# Patient Record
Sex: Male | Born: 1960 | Race: Black or African American | Hispanic: No | Marital: Single | Smoking: Current every day smoker
Health system: Southern US, Community
[De-identification: ages and names within clinical notes are randomized; demographics above are authoritative.]

## PROBLEM LIST (undated history)

## (undated) HISTORY — PX: NECK SURGERY: SHX720

---

## 2000-11-18 ENCOUNTER — Encounter: Payer: Self-pay | Admitting: Emergency Medicine

## 2000-11-18 ENCOUNTER — Emergency Department (HOSPITAL_COMMUNITY): Admission: EM | Admit: 2000-11-18 | Discharge: 2000-11-18 | Payer: Self-pay | Admitting: *Deleted

## 2004-06-21 ENCOUNTER — Inpatient Hospital Stay (HOSPITAL_COMMUNITY): Admission: AD | Admit: 2004-06-21 | Discharge: 2004-07-02 | Payer: Self-pay | Admitting: Psychiatry

## 2004-06-21 ENCOUNTER — Ambulatory Visit: Payer: Self-pay | Admitting: Psychiatry

## 2008-08-23 ENCOUNTER — Emergency Department: Payer: Self-pay | Admitting: Emergency Medicine

## 2011-09-17 ENCOUNTER — Ambulatory Visit: Payer: Self-pay | Admitting: Rheumatology

## 2012-08-26 ENCOUNTER — Emergency Department (HOSPITAL_COMMUNITY)
Admission: EM | Admit: 2012-08-26 | Discharge: 2012-08-26 | Disposition: A | Discharge status: Home / Self Care | Payer: PRIVATE HEALTH INSURANCE | Attending: Emergency Medicine | Admitting: Emergency Medicine

## 2012-08-26 ENCOUNTER — Emergency Department (HOSPITAL_COMMUNITY): Payer: PRIVATE HEALTH INSURANCE

## 2012-08-26 ENCOUNTER — Encounter (HOSPITAL_COMMUNITY): Payer: Self-pay | Admitting: *Deleted

## 2012-08-26 DIAGNOSIS — F17200 Nicotine dependence, unspecified, uncomplicated: Secondary | ICD-10-CM | POA: Insufficient documentation

## 2012-08-26 DIAGNOSIS — R0789 Other chest pain: Secondary | ICD-10-CM | POA: Insufficient documentation

## 2012-08-26 DIAGNOSIS — R209 Unspecified disturbances of skin sensation: Secondary | ICD-10-CM | POA: Insufficient documentation

## 2012-08-26 LAB — POCT I-STAT TROPONIN I

## 2012-08-26 LAB — COMPREHENSIVE METABOLIC PANEL
AST: 25 U/L (ref 0–37)
Albumin: 3.7 g/dL (ref 3.5–5.2)
Alkaline Phosphatase: 49 U/L (ref 39–117)
Chloride: 106 mEq/L (ref 96–112)
Potassium: 4.2 mEq/L (ref 3.5–5.1)
Total Bilirubin: 0.1 mg/dL — ABNORMAL LOW (ref 0.3–1.2)

## 2012-08-26 LAB — CBC
Platelets: 291 10*3/uL (ref 150–400)
RDW: 15.3 % (ref 11.5–15.5)
WBC: 4.5 10*3/uL (ref 4.0–10.5)

## 2012-08-26 MED ORDER — TRAMADOL HCL 50 MG PO TABS: 50.0000 mg | ORAL_TABLET | Freq: Four times a day (QID) | ORAL | Status: AC | PRN

## 2012-08-26 MED ORDER — NITROGLYCERIN 0.4 MG SL SUBL
0.4000 mg | SUBLINGUAL_TABLET | SUBLINGUAL | Status: AC | PRN
  Administered 2012-08-26 (×3): 0.4 mg via SUBLINGUAL
  Filled 2012-08-26: qty 75

## 2012-08-26 MED ORDER — ASPIRIN 81 MG PO CHEW
324.0000 mg | CHEWABLE_TABLET | Freq: Once | ORAL | Status: AC
  Administered 2012-08-26: 324 mg via ORAL
  Filled 2012-08-26: qty 4

## 2012-08-26 MED ORDER — OXYCODONE-ACETAMINOPHEN 5-325 MG PO TABS
1.0000 | ORAL_TABLET | Freq: Once | ORAL | Status: AC
  Administered 2012-08-26: 1 via ORAL
  Filled 2012-08-26: qty 1

## 2012-08-26 NOTE — ED Provider Notes (Signed)
History     CSN: [id number]  Arrival date & time 08/26/12  6213   First MD Initiated Contact with Patient 08/26/12 [phone]      Chief Complaint  Patient presents with  . Tingling  . Chest Pain    (Consider location/radiation/quality/duration/timing/severity/associated sxs/prior treatment) HPI  Louis Johnston is a 52 y.o. male c/o left-sided chest pain, radiating to the left arm, described as sharp, rated at 8/10 at worst, 8/10 now. Relieved by nothing. Pain started x3days ago. Pain has been constant, non-exertional. Patient endorses a positional pain that is exacerbated by palpation and deep breathing.  Denies SOB, N/V, diaphoresis, cough, fever, back pain, syncope, prior episodes, recent cocaine/methamphetimine use. Denies h/o DVT, PE,  recent travel/immobilization, leg swelling, hemoptysis.  Pt has not received any ASA or NTG in the last 24 hours.  RF: tobacco abuse Cath: ?   Last Stress test: ? Cardiologost: ? PCP:?   History reviewed. No pertinent past medical history.  Past Surgical History  Procedure Laterality Date  . Neck surgery      No family history on file.  History  Substance Use Topics  . Smoking status: Current Every Day Smoker -- 0.50 packs/day    Types: Cigarettes  . Smokeless tobacco: Not on file  . Alcohol Use: No      Review of Systems  Constitutional: Negative for fever.  Respiratory: Negative for shortness of breath.   Cardiovascular: Positive for chest pain.  Gastrointestinal: Negative for nausea, vomiting, abdominal pain and diarrhea.  All other systems reviewed and are negative.    Allergies  Banana and Tomato  Home Medications  No current outpatient prescriptions on file.  BP 125/79  Pulse 61  Temp(Src) 98.7 F (37.1 C) (Oral)  Resp 20  Ht 5\' 11"  (1.803 m)  Wt 165 lb (74.844 kg)  BMI 23.02 kg/m2  SpO2 99%  Physical Exam  Nursing note and vitals reviewed. Constitutional: He is oriented to person, place, and time. He  appears well-developed and well-nourished. No distress.  HENT:  Head: Normocephalic and atraumatic.  Mouth/Throat: Oropharynx is clear and moist.  Eyes: Conjunctivae and EOM are normal. Pupils are equal, round, and reactive to light.  Neck: Normal range of motion.  Cardiovascular: Normal rate, regular rhythm, normal heart sounds and intact distal pulses.   Pulmonary/Chest: Effort normal and breath sounds normal. No stridor. No respiratory distress. He has no wheezes. He has no rales. He exhibits tenderness.    Chest pain is  reproducible and there is exquisite tenderness to palpation of the left lower chest. No crepitance.   Abdominal: Soft. Bowel sounds are normal. He exhibits no distension and no mass. There is no tenderness. There is no rebound and no guarding.  Musculoskeletal: Normal range of motion.  Neurological: He is alert and oriented to person, place, and time.  Strength is 5 out of 5x4 extremities, distal sensation intact to pinprick and light touch  Psychiatric: He has a normal mood and affect.    ED Course  Procedures (including critical care time)  Labs Reviewed  COMPREHENSIVE METABOLIC PANEL - Abnormal; Notable for the following:    Total Bilirubin 0.1 (*)    All other components within normal limits  CBC  D-DIMER, QUANTITATIVE  POCT I-STAT TROPONIN I   Dg Chest 2 View  08/26/2012  *RADIOLOGY REPORT*  Clinical Data: Chest tightness and shortness of breath.  Smoker.  CHEST - 2 VIEW  Comparison: None.  Findings: Lungs clear. Heart size normal. No pneumothorax or  pleural effusion. Postoperative change of lower cervical fusion is noted.  The patient is also status post rotator cuff repair on the right.  IMPRESSION: Negative chest.   Original Report Authenticated By: Holley Dexter, M.D.     Date: 08/26/2012  Rate: 61  Rhythm: normal sinus rhythm  QRS Axis: normal  Intervals: normal  ST/T Wave abnormalities: normal  Conduction Disutrbances:none  Narrative  Interpretation:   Old EKG Reviewed: unchanged   1. Atypical chest pain       MDM   Louis Johnston is a 52 y.o. male with positional left chest pain for the last 3 days. EKG is nonischemic and troponin is negative, CBC shows no anemia, there are no electrolyte abnormalities and chest x-ray is normal. PE unlikely based on lack of risk factors and physical exam.   This is a shared visit with the attending physician who personally evaluated the patient and agrees with the care plan.   Filed Vitals:   08/26/12 0923 08/26/12 0936  BP: 125/79   Pulse: 61   Temp: 98.7 F (37.1 C)   TempSrc: Oral   Resp: 20   Height: 5\' 11"  (1.803 m) 5\' 11"  (1.803 m)  Weight: 165 lb (74.844 kg) 165 lb (74.844 kg)  SpO2: 99%      Pt verbalized understanding and agrees with care plan. Outpatient follow-up and return precautions given.    New Prescriptions   TRAMADOL (ULTRAM) 50 MG TABLET    Take 1 tablet (50 mg total) by mouth every 6 (six) hours as needed for pain.           Wynetta Emery, PA-C 08/26/12 1249

## 2012-08-26 NOTE — ED Notes (Signed)
Pt complains of left hand tingling x 3 days.

## 2012-08-26 NOTE — Discharge Instructions (Signed)
For pain control you may take:  800mg of ibuprofen (that is usually 4 over the counter pills)  3 times a day (take with food) andacetaminophen 975mg (this is 3 over the counter pills) four times a day. Do not drink alcohol or combine with other medications that have acetaminophen as an ingredient (Read the labels!).  For breakthrough pain you may take tramadol. Do not drink alcohol drive or operate heavy machinery when taking tramadol. ° ° °

## 2012-08-26 NOTE — ED Provider Notes (Signed)
Medical screening examination/treatment/procedure(s) were conducted as a shared visit with non-physician practitioner(s) and myself.  I personally evaluated the patient during the encounter  Constant L sided chest pain worse with palpation, worse with position changes.  RRR, CTAB.  Glynn Octave, MD 08/26/12 1705

## 2012-08-26 NOTE — ED Notes (Signed)
Pt complains of having chest pain and tingling in his left arm x 3 days.

## 2014-05-09 ENCOUNTER — Encounter (HOSPITAL_COMMUNITY): Payer: Self-pay | Admitting: Emergency Medicine

## 2014-05-09 ENCOUNTER — Emergency Department (HOSPITAL_COMMUNITY)
Admission: EM | Admit: 2014-05-09 | Discharge: 2014-05-09 | Disposition: A | Discharge status: Home / Self Care | Payer: Medicare PPO | Attending: Emergency Medicine | Admitting: Emergency Medicine

## 2014-05-09 DIAGNOSIS — Z720 Tobacco use: Secondary | ICD-10-CM | POA: Insufficient documentation

## 2014-05-09 DIAGNOSIS — M109 Gout, unspecified: Secondary | ICD-10-CM | POA: Diagnosis not present

## 2014-05-09 DIAGNOSIS — M79671 Pain in right foot: Secondary | ICD-10-CM | POA: Diagnosis present

## 2014-05-09 MED ORDER — OXYCODONE-ACETAMINOPHEN 5-325 MG PO TABS
1.0000 | ORAL_TABLET | Freq: Once | ORAL | Status: AC
  Administered 2014-05-09: 1 via ORAL
  Filled 2014-05-09: qty 1

## 2014-05-09 MED ORDER — OXYCODONE-ACETAMINOPHEN 5-325 MG PO TABS: 1.0000 | ORAL_TABLET | Freq: Four times a day (QID) | ORAL | Status: DC | PRN

## 2014-05-09 MED ORDER — INDOMETHACIN 50 MG PO CAPS: 50.0000 mg | ORAL_CAPSULE | Freq: Three times a day (TID) | ORAL | Status: AC

## 2014-05-09 NOTE — ED Notes (Signed)
Pt c/o sharp pain to right foot since christmas day. Pt states foot and toes are swollen. Denies injury.

## 2014-05-09 NOTE — ED Provider Notes (Signed)
CSN: [id number]     Arrival date & time 05/09/14  1126 History   First MD Initiated Contact with Patient 05/09/14 1137     Chief Complaint  Patient presents with  . Foot Pain     (Consider location/radiation/quality/duration/timing/severity/associated sxs/prior Treatment) HPI Comments: Patient with a history of Gout presents today with right foot pain.  Pain has been present for the past 4 days and is gradually worsening.  He reports that his entire foot is painful, but the pain is worse in the area of the MTP of the right great toe.  He reports associated redness and warmth of the area.  He has not taken anything for pain prior to arrival.  He states that he drank alcohol and ate red meat on Christmas.  Denies any injury or trauma.  No numbness or tingling.  No fever, chills, nausea, or vomiting.    The history is provided by the patient.    History reviewed. No pertinent past medical history. Past Surgical History  Procedure Laterality Date  . Neck surgery     No family history on file. History  Substance Use Topics  . Smoking status: Current Every Day Smoker -- 0.50 packs/day    Types: Cigarettes  . Smokeless tobacco: Not on file  . Alcohol Use: No    Review of Systems  All other systems reviewed and are negative.     Allergies  Banana and Tomato  Home Medications   Prior to Admission medications   Medication Sig Start Date End Date Taking? Authorizing Provider  traMADol (ULTRAM) 50 MG tablet Take 1 tablet (50 mg total) by mouth every 6 (six) hours as needed for pain. 08/26/12   Nicole Pisciotta, PA-C   BP 152/95 mmHg  Pulse 84  Temp(Src) 98.3 F (36.8 C) (Oral)  Resp 20  SpO2 98% Physical Exam  Constitutional: He appears well-developed and well-nourished.  HENT:  Head: Normocephalic and atraumatic.  Neck: Normal range of motion.  Cardiovascular: Normal rate, regular rhythm and normal heart sounds.   Pulses:      Dorsalis pedis pulses are 2+ on the right  side, and 2+ on the left side.  Pulmonary/Chest: Effort normal and breath sounds normal.  Musculoskeletal: Normal range of motion.  Erythema, tenderness, warmth, and mild swelling of the right 1st MTP  Neurological: He is alert.  Distal sensation of the right foot intact  Skin: Skin is warm and dry.  Psychiatric: He has a normal mood and affect.  Nursing note and vitals reviewed.   ED Course  Procedures (including critical care time) Labs Review Labs Reviewed - No data to display  Imaging Review No results found.   EKG Interpretation None      MDM   Final diagnoses:  None   Patient with a history of Gout presents today with pain, erythema, and swelling of the MTP of the right great toe.  Signs and symptoms most consistent with Gout.  He is afebrile.  No injury or trauma.  Neurovascularly intact.  Feel that the patient is stable for discharge.  Discharged with Rx for Indomethicin.  Instructed to follow up with PCP.  Return precautions given.     Santiago GladHeather Imara Standiford, PA-C 05/09/14 2322  Merrie RoofJohn David Wofford III, MD 05/10/14 [phone]

## 2014-05-23 ENCOUNTER — Encounter (HOSPITAL_COMMUNITY): Payer: Self-pay | Admitting: Emergency Medicine

## 2014-05-23 ENCOUNTER — Emergency Department (HOSPITAL_COMMUNITY): Payer: Medicare PPO

## 2014-05-23 ENCOUNTER — Emergency Department (HOSPITAL_COMMUNITY)
Admission: EM | Admit: 2014-05-23 | Discharge: 2014-05-23 | Disposition: A | Discharge status: Home / Self Care | Payer: Medicare PPO | Attending: Emergency Medicine | Admitting: Emergency Medicine

## 2014-05-23 DIAGNOSIS — M79674 Pain in right toe(s): Secondary | ICD-10-CM | POA: Insufficient documentation

## 2014-05-23 DIAGNOSIS — Z791 Long term (current) use of non-steroidal anti-inflammatories (NSAID): Secondary | ICD-10-CM | POA: Insufficient documentation

## 2014-05-23 DIAGNOSIS — M109 Gout, unspecified: Secondary | ICD-10-CM | POA: Diagnosis not present

## 2014-05-23 DIAGNOSIS — Z720 Tobacco use: Secondary | ICD-10-CM | POA: Diagnosis not present

## 2014-05-23 LAB — I-STAT CHEM 8, ED
BUN: 13 mg/dL (ref 6–23)
BUN: 19 mg/dL (ref 6–23)
BUN: 19 mg/dL (ref 6–23)
CALCIUM ION: 1.28 mmol/L — AB (ref 1.12–1.23)
CHLORIDE: 105 meq/L (ref 96–112)
CREATININE: 1 mg/dL (ref 0.50–1.35)
Calcium, Ion: 1.17 mmol/L (ref 1.12–1.23)
Calcium, Ion: 1.18 mmol/L (ref 1.12–1.23)
Chloride: 108 mEq/L (ref 96–112)
Chloride: 108 mEq/L (ref 96–112)
Creatinine, Ser: 1.1 mg/dL (ref 0.50–1.35)
Creatinine, Ser: 1.2 mg/dL (ref 0.50–1.35)
GLUCOSE: 97 mg/dL (ref 70–99)
Glucose, Bld: 91 mg/dL (ref 70–99)
Glucose, Bld: 92 mg/dL (ref 70–99)
HCT: 53 % — ABNORMAL HIGH (ref 39.0–52.0)
HEMATOCRIT: 54 % — AB (ref 39.0–52.0)
HEMATOCRIT: 54 % — AB (ref 39.0–52.0)
HEMOGLOBIN: 18.4 g/dL — AB (ref 13.0–17.0)
Hemoglobin: 18 g/dL — ABNORMAL HIGH (ref 13.0–17.0)
Hemoglobin: 18.4 g/dL — ABNORMAL HIGH (ref 13.0–17.0)
POTASSIUM: 6.2 mmol/L — AB (ref 3.5–5.1)
POTASSIUM: 4.9 mmol/L (ref 3.5–5.1)
POTASSIUM: 6.6 mmol/L — AB (ref 3.5–5.1)
SODIUM: 140 mmol/L (ref 135–145)
SODIUM: 140 mmol/L (ref 135–145)
Sodium: 142 mmol/L (ref 135–145)
TCO2: 26 mmol/L (ref 0–100)
TCO2: 25 mmol/L (ref 0–100)
TCO2: 27 mmol/L (ref 0–100)

## 2014-05-23 LAB — CBC WITH DIFFERENTIAL/PLATELET
Basophils Absolute: 0.1 10*3/uL (ref 0.0–0.1)
Basophils Relative: 2 % — ABNORMAL HIGH (ref 0–1)
EOS ABS: 0.4 10*3/uL (ref 0.0–0.7)
EOS PCT: 6 % — AB (ref 0–5)
HCT: 50.4 % (ref 39.0–52.0)
Hemoglobin: 16.3 g/dL (ref 13.0–17.0)
LYMPHS ABS: 2.3 10*3/uL (ref 0.7–4.0)
Lymphocytes Relative: 33 % (ref 12–46)
MCH: 29.2 pg (ref 26.0–34.0)
MCHC: 32.3 g/dL (ref 30.0–36.0)
MCV: 90.2 fL (ref 78.0–100.0)
MONOS PCT: 7 % (ref 3–12)
Monocytes Absolute: 0.5 10*3/uL (ref 0.1–1.0)
Neutro Abs: 3.6 10*3/uL (ref 1.7–7.7)
Neutrophils Relative %: 52 % (ref 43–77)
Platelets: 340 10*3/uL (ref 150–400)
RBC: 5.59 MIL/uL (ref 4.22–5.81)
RDW: 15.6 % — ABNORMAL HIGH (ref 11.5–15.5)
WBC: 6.9 10*3/uL (ref 4.0–10.5)

## 2014-05-23 LAB — BASIC METABOLIC PANEL
ANION GAP: 7 (ref 5–15)
BUN: 14 mg/dL (ref 6–23)
CO2: 26 mmol/L (ref 19–32)
Calcium: 9.4 mg/dL (ref 8.4–10.5)
Chloride: 105 mEq/L (ref 96–112)
Creatinine, Ser: 1.01 mg/dL (ref 0.50–1.35)
GFR calc Af Amer: >90 mL/min (ref >90)
GFR calc non Af Amer: 82 mL/min — ABNORMAL LOW (ref >90)
GLUCOSE: 86 mg/dL (ref 70–99)
Potassium: 4 mmol/L (ref 3.5–5.1)
Sodium: 138 mmol/L (ref 135–145)

## 2014-05-23 MED ORDER — OXYCODONE-ACETAMINOPHEN 5-325 MG PO TABS: 2.0000 | ORAL_TABLET | Freq: Four times a day (QID) | ORAL | Status: AC | PRN

## 2014-05-23 MED ORDER — OXYCODONE-ACETAMINOPHEN 5-325 MG PO TABS
2.0000 | ORAL_TABLET | Freq: Once | ORAL | Status: AC
  Administered 2014-05-23: 2 via ORAL
  Filled 2014-05-23: qty 2

## 2014-05-23 NOTE — ED Notes (Signed)
Pt c/o right foot pain since Christmas, was seen on 05/09/14 for same. Pt states under his great toe, it is turning black and red.

## 2014-05-23 NOTE — ED Notes (Signed)
Patient transported to X-ray 

## 2014-05-23 NOTE — ED Notes (Signed)
MD at bedside. 

## 2014-05-23 NOTE — Discharge Instructions (Signed)

## 2014-05-23 NOTE — ED Provider Notes (Signed)
CSN: [id number]     Arrival date & time 05/23/14  0919 History   First MD Initiated Contact with Patient 05/23/14 0932     Chief Complaint  Patient presents with  . Foot Pain     (Consider location/radiation/quality/duration/timing/severity/associated sxs/prior Treatment) HPI Comments: Patient with no pertinent past medical history presents to emergency department with chief complaints of right great toe pain. Patient states that he was seen here recently around Christmas for the same. He was treated for gout with indomethacin. Patient states that he has had worsening toe pain, now his toes turning black and red. He denies any fevers, chills, nausea, vomiting. It is aggravated with palpation and movement. Patient denies any mechanism of injury. He denies any history of diabetes. When he was seen around Christmas he also had redness of the foot and ankle, this has since resolved.  The history is provided by the patient. No language interpreter was used.    History reviewed. No pertinent past medical history. Past Surgical History  Procedure Laterality Date  . Neck surgery     No family history on file. History  Substance Use Topics  . Smoking status: Current Every Day Smoker -- 0.50 packs/day    Types: Cigarettes  . Smokeless tobacco: Not on file  . Alcohol Use: No    Review of Systems  Constitutional: Negative for fever and chills.  Respiratory: Negative for shortness of breath.   Cardiovascular: Negative for chest pain.  Gastrointestinal: Negative for nausea, vomiting, diarrhea and constipation.  Genitourinary: Negative for dysuria.  Musculoskeletal: Positive for arthralgias.  Skin: Positive for color change.  All other systems reviewed and are negative.     Allergies  Banana and Tomato  Home Medications   Prior to Admission medications   Medication Sig Start Date End Date Taking? Authorizing Provider  indomethacin (INDOCIN) 50 MG capsule Take 1 capsule (50 mg total)  by mouth 3 (three) times daily with meals. 05/09/14   Santiago Glad, PA-C  oxyCODONE-acetaminophen (PERCOCET/ROXICET) 5-325 MG per tablet Take 1-2 tablets by mouth every 6 (six) hours as needed for severe pain. 05/09/14   Heather Laisure, PA-C  traMADol (ULTRAM) 50 MG tablet Take 1 tablet (50 mg total) by mouth every 6 (six) hours as needed for pain. 08/26/12   Nicole Pisciotta, PA-C   BP 148/90 mmHg  Pulse 77  Temp(Src) 97.9 F (36.6 C) (Oral)  Resp 19  SpO2 97% Physical Exam  Constitutional: He is oriented to person, place, and time. He appears well-developed and well-nourished.  HENT:  Head: Normocephalic and atraumatic.  Eyes: Conjunctivae and EOM are normal.  Neck: Normal range of motion.  Cardiovascular: Normal rate.   Pulmonary/Chest: Effort normal.  Abdominal: He exhibits no distension.  Musculoskeletal: Normal range of motion.  No obvious bony abnormality or deformity about the right great toe, great toe ROM and strength limited 2/2 pain, otherwise ROM and strength intact   Neurological: He is alert and oriented to person, place, and time.  Skin: Skin is dry.  Right great toe is moderately swollen, with some underlying soft tissue changes, possibly infection  Psychiatric: He has a normal mood and affect. His behavior is normal. Judgment and thought content normal.  Nursing note and vitals reviewed.   ED Course  Procedures (including critical care time) Results for orders placed or performed during the hospital encounter of 05/23/14  CBC with Differential  Result Value Ref Range   WBC 6.9 4.0 - 10.5 K/uL   RBC 5.59 4.22 -  5.81 MIL/uL   Hemoglobin 16.3 13.0 - 17.0 g/dL   HCT 96.0 45.4 - 09.8 %   MCV 90.2 78.0 - 100.0 fL   MCH 29.2 26.0 - 34.0 pg   MCHC 32.3 30.0 - 36.0 g/dL   RDW 11.9 (H) 14.7 - 82.9 %   Platelets 340 150 - 400 K/uL   Neutrophils Relative % 52 43 - 77 %   Neutro Abs 3.6 1.7 - 7.7 K/uL   Lymphocytes Relative 33 12 - 46 %   Lymphs Abs 2.3 0.7 - 4.0  K/uL   Monocytes Relative 7 3 - 12 %   Monocytes Absolute 0.5 0.1 - 1.0 K/uL   Eosinophils Relative 6 (H) 0 - 5 %   Eosinophils Absolute 0.4 0.0 - 0.7 K/uL   Basophils Relative 2 (H) 0 - 1 %   Basophils Absolute 0.1 0.0 - 0.1 K/uL  Basic metabolic panel  Result Value Ref Range   Sodium 138 135 - 145 mmol/L   Potassium 4.0 3.5 - 5.1 mmol/L   Chloride 105 96 - 112 mEq/L   CO2 26 19 - 32 mmol/L   Glucose, Bld 86 70 - 99 mg/dL   BUN 14 6 - 23 mg/dL   Creatinine, Ser 5.62 0.50 - 1.35 mg/dL   Calcium 9.4 8.4 - 13.0 mg/dL   GFR calc non Af Amer 82 (L) >90 mL/min   GFR calc Af Amer >90 >90 mL/min   Anion gap 7 5 - 15  I-stat chem 8, ed  Result Value Ref Range   Sodium 140 135 - 145 mmol/L   Potassium 6.2 (HH) 3.5 - 5.1 mmol/L   Chloride 108 96 - 112 mEq/L   BUN 19 6 - 23 mg/dL   Creatinine, Ser 8.65 0.50 - 1.35 mg/dL   Glucose, Bld 91 70 - 99 mg/dL   Calcium, Ion 7.84 6.96 - 1.23 mmol/L   TCO2 26 0 - 100 mmol/L   Hemoglobin 18.4 (H) 13.0 - 17.0 g/dL   HCT 29.5 (H) 28.4 - 13.2 %   Comment NOTIFIED PHYSICIAN   I-stat chem 8, ed  Result Value Ref Range   Sodium 140 135 - 145 mmol/L   Potassium 6.6 (HH) 3.5 - 5.1 mmol/L   Chloride 108 96 - 112 mEq/L   BUN 19 6 - 23 mg/dL   Creatinine, Ser 4.40 0.50 - 1.35 mg/dL   Glucose, Bld 92 70 - 99 mg/dL   Calcium, Ion 1.02 7.25 - 1.23 mmol/L   TCO2 27 0 - 100 mmol/L   Hemoglobin 18.4 (H) 13.0 - 17.0 g/dL   HCT 36.6 (H) 44.0 - 34.7 %   Comment NOTIFIED PHYSICIAN   I-stat chem 8, ed  Result Value Ref Range   Sodium 142 135 - 145 mmol/L   Potassium 4.9 3.5 - 5.1 mmol/L   Chloride 105 96 - 112 mEq/L   BUN 13 6 - 23 mg/dL   Creatinine, Ser 4.25 0.50 - 1.35 mg/dL   Glucose, Bld 97 70 - 99 mg/dL   Calcium, Ion 9.56 (H) 1.12 - 1.23 mmol/L   TCO2 25 0 - 100 mmol/L   Hemoglobin 18.0 (H) 13.0 - 17.0 g/dL   HCT 38.7 (H) 56.4 - 33.2 %   Dg Toe Great Right  05/23/2014   CLINICAL DATA:  Gout in right great toe. Pain. Soft tissue swelling with  discoloration.  EXAM: RIGHT GREAT TOE  COMPARISON:  None.  FINDINGS: There is no evidence of fracture or dislocation.  There is no evidence of arthropathy or other focal bone abnormality. Soft tissues are unremarkable.  IMPRESSION: Negative.   Electronically Signed   By: Charlett NoseKevin  Dover M.D.   On: 05/23/2014 10:25      EKG Interpretation None      MDM   Final diagnoses:  Toe pain, right    Patient with right toe pain. Labs and imaging are reassuring. No evidence of infection. Will treat with pain medicine, give postop shoe, and recommend outpatient follow-up with orthopedics. Patient seen by and discussed with Dr. Jeraldine LootsLockwood, who agrees with the plan. She is stable and ready for discharge.    Roxy Horsemanobert Ruffin Lada, PA-C 05/23/14 1625  Gerhard Munchobert Lockwood, MD 05/23/14 [phone]

## 2014-05-23 NOTE — ED Notes (Signed)
PA at bedside.

## 2014-05-23 NOTE — Progress Notes (Addendum)
Pt initially informed Cm he did not have pc but confirmed medicare and medicaid coverage CM educated pt that if [place] he is assigned to a pcp. His pcp is [facility] [phone] Pt is notw aware of theis Information entered in d/c instructions Pt is aware He states his medicaid card is broken and he is aware of DSS number and will get a new one

## 2015-03-13 DEATH — deceased

## 2016-05-21 IMAGING — CR DG TOE GREAT 2+V*R*
3 series · 3 of 3 positions shown · non-contrast
Comparison: None.

CLINICAL DATA: Gout in right great toe. Pain. Soft tissue swelling
with discoloration.

EXAM:
RIGHT GREAT TOE

[x toes ap right]
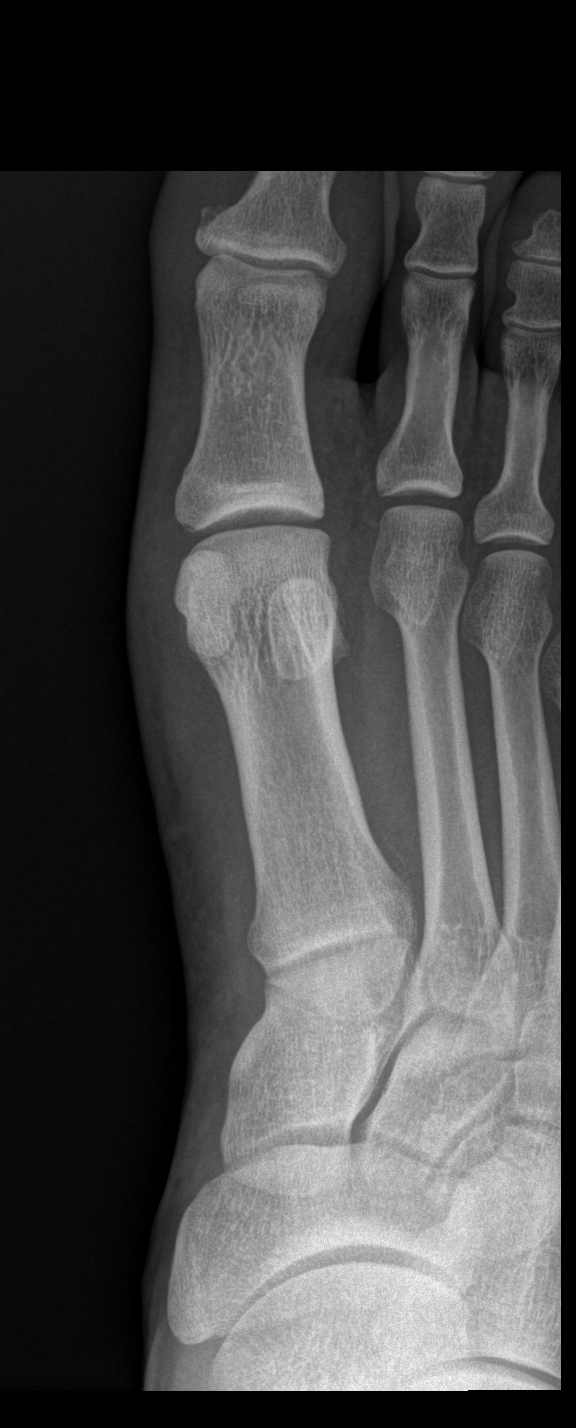

[x toes obl right]
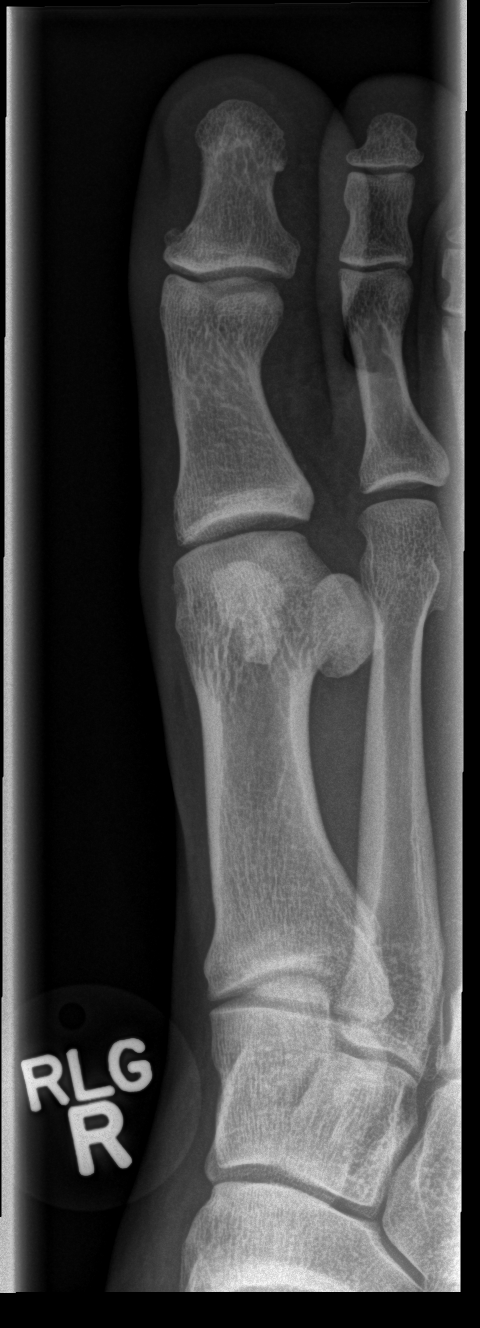

[x toes lat right]
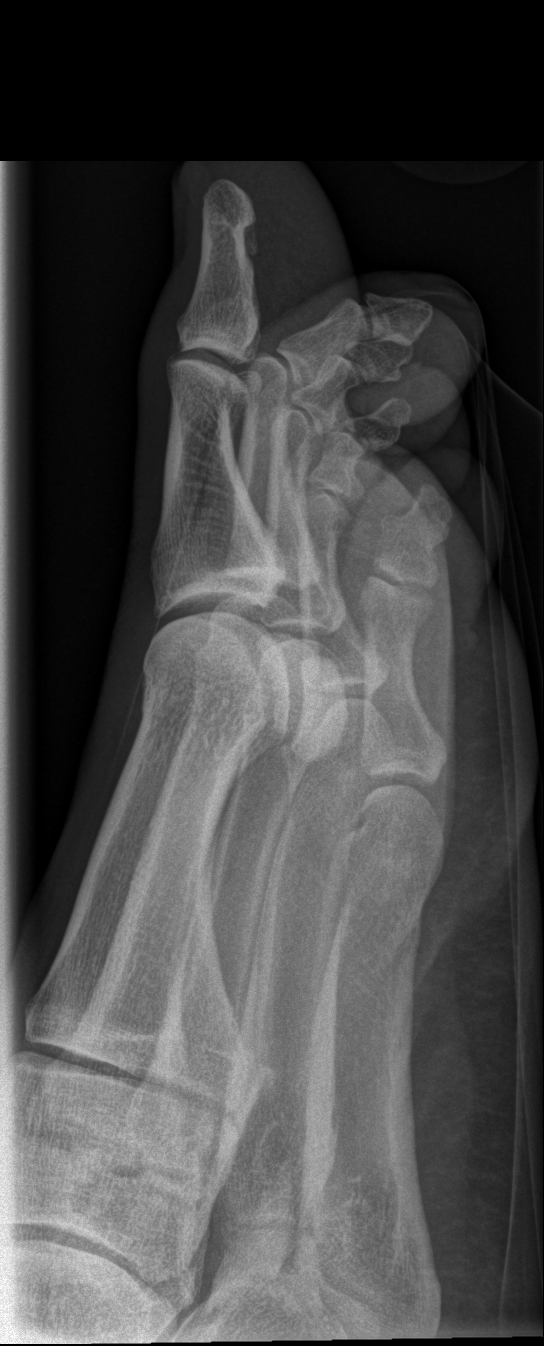

[3 of 3 positions shown; findings below may reference images not displayed]

FINDINGS: There is no evidence of fracture or dislocation. There is no
evidence of arthropathy or other focal bone abnormality. Soft
tissues are unremarkable.
IMPRESSION: Negative.
